# Patient Record
Sex: Female | Born: 1960 | Race: Black or African American | Hispanic: No | Marital: Married | Smoking: Never smoker
Health system: Southern US, Community
[De-identification: ages and names within clinical notes are randomized; demographics above are authoritative.]

## PROBLEM LIST (undated history)

## (undated) HISTORY — PX: NO PAST SURGERIES: SHX2092

---

## 2017-02-23 ENCOUNTER — Telehealth: Payer: Self-pay | Admitting: Emergency Medicine

## 2017-02-23 ENCOUNTER — Ambulatory Visit
Admission: EM | Admit: 2017-02-23 | Discharge: 2017-02-23 | Disposition: A | Discharge status: Home / Self Care | Payer: Managed Care, Other (non HMO) | Attending: Family Medicine | Admitting: Family Medicine

## 2017-02-23 ENCOUNTER — Other Ambulatory Visit: Payer: Self-pay

## 2017-02-23 DIAGNOSIS — R059 Cough, unspecified: Secondary | ICD-10-CM

## 2017-02-23 DIAGNOSIS — J9801 Acute bronchospasm: Secondary | ICD-10-CM

## 2017-02-23 DIAGNOSIS — R05 Cough: Secondary | ICD-10-CM

## 2017-02-23 DIAGNOSIS — J0100 Acute maxillary sinusitis, unspecified: Secondary | ICD-10-CM

## 2017-02-23 MED ORDER — BENZONATATE 100 MG PO CAPS: 100.0000 mg | ORAL_CAPSULE | Freq: Three times a day (TID) | ORAL | 0 refills | Status: DC | PRN

## 2017-02-23 MED ORDER — PREDNISONE 10 MG PO TABS: ORAL_TABLET | ORAL | 0 refills | Status: DC

## 2017-02-23 MED ORDER — ALBUTEROL SULFATE HFA 108 (90 BASE) MCG/ACT IN AERS: 2.0000 | INHALATION_SPRAY | RESPIRATORY_TRACT | 0 refills | Status: DC | PRN

## 2017-02-23 MED ORDER — DOXYCYCLINE HYCLATE 100 MG PO CAPS: 100.0000 mg | ORAL_CAPSULE | Freq: Two times a day (BID) | ORAL | 0 refills | Status: DC

## 2017-02-23 MED ORDER — HYDROCOD POLST-CPM POLST ER 10-8 MG/5ML PO SUER: 5.0000 mL | Freq: Every evening | ORAL | 0 refills | Status: DC | PRN

## 2017-02-23 NOTE — Telephone Encounter (Signed)
[facility]ex sent to [facility], [facility] out of stock, nursing canceled original rx.

## 2017-02-23 NOTE — Discharge Instructions (Signed)
Take medication as prescribed. Rest. Drink plenty of fluids.  ° °Follow up with your primary care physician this week as needed. Return to Urgent care for new or worsening concerns.  ° °

## 2017-02-23 NOTE — ED Triage Notes (Signed)
Pt c/o coughing, heaviness in her chest, hot/cold chills, dizzy, and nausea for about a week. OTC meds haven't helped.

## 2017-02-23 NOTE — ED Provider Notes (Signed)
[facility] ____________________________________________  Time seen: Approximately 11:21 AM  I have reviewed the triage vital signs and the nursing notes.   HISTORY  Chief Complaint Cough   HPI Christina Shelton is a 57 y.o. female presenting for was 1.5 weeks of cough and congestion.  States symptoms started off more like a cold with runny nose, nasal congestion, then progressed to a cough.  Patient reports towards the end of last week she thought that she was starting to get better, and reports worsened over the weekend.  States that she has had increased sinus pressure sensation, thick postnasal drainage as well as cough.  States that she does have some soreness from coughing so frequently as as some intermittent chest tightness sensation with cough.  Denies chest pain or shortness of breath.  States no known wheezing.  States cough disrupting sleep. Reports she has a had a history of significant seasonal allergies and sinusitis, and reports she is been told in the past she has borderline asthma due to allergies.  Has been trying multiple over-the-counter cough and congestion agents.  States at sickness onset she may have had a fever, denies recent known fevers.  Has continue to remain active.  Try to go to work today but was sent home due to her cough.  States that she works at an [employer] and she has had a lot of patients with cough and congestion symptoms.  Has continued to overall eat and drink well.  States some throat irritation no persistent sore throat.  Denies recent sickness.  Denies cardiac history, renal insufficiency, or blood clot history.  Denies chest pain, shortness of breath, abdominal pain, dysuria, extremity pain, extremity swelling or rash. Denies recent sickness. Denies recent antibiotic use.     History reviewed. No pertinent past medical history. Allergies  There are no active problems to display for this patient.   History reviewed. No  pertinent surgical history.   No current facility-administered medications for this encounter.   Current Outpatient Medications:  .  albuterol (PROVENTIL HFA;VENTOLIN HFA) 108 (90 Base) MCG/ACT inhaler, Inhale 2 puffs into the lungs every 6 (six) hours as needed for wheezing or shortness of breath., Disp: , Rfl:  .  cetirizine (ZYRTEC) 5 MG tablet, Take 5 mg by mouth daily., Disp: , Rfl:  .  albuterol (PROVENTIL HFA;VENTOLIN HFA) 108 (90 Base) MCG/ACT inhaler, Inhale 2 puffs into the lungs every 4 (four) hours as needed for wheezing., Disp: 1 Inhaler, Rfl: 0 .  benzonatate (TESSALON PERLES) 100 MG capsule, Take 1 capsule (100 mg total) by mouth 3 (three) times daily as needed for cough., Disp: 15 capsule, Rfl: 0 .  chlorpheniramine-HYDROcodone (TUSSIONEX PENNKINETIC ER) 10-8 MG/5ML SUER, Take 5 mLs by mouth at bedtime as needed for cough. do not drive or operate machinery while taking as can cause drowsiness., Disp: 75 mL, Rfl: 0 .  doxycycline (VIBRAMYCIN) 100 MG capsule, Take 1 capsule (100 mg total) by mouth 2 (two) times daily., Disp: 20 capsule, Rfl: 0 .  predniSONE (DELTASONE) 10 MG tablet, Start 60 mg po day one, then 50 mg po day two, taper by 10 mg daily until complete., Disp: 21 tablet, Rfl: 0  Allergies Patient has no known allergies.  History reviewed. No pertinent family history.  Social History Social History   Tobacco Use  . Smoking status: Never Smoker  . Smokeless tobacco: Never Used  Substance Use Topics  . Alcohol use: No    Frequency: Never  . Drug use:  No    Review of Systems Constitutional: No fever/chills ENT: As above.  Cardiovascular: Denies chest pain. Respiratory: Denies shortness of breath. Gastrointestinal: No abdominal pain.  No nausea, no vomiting.  No diarrhea.  No constipation. Genitourinary: Negative for dysuria. Musculoskeletal: Negative for back pain. Skin: Negative for rash. Neurological: Negative for focal weakness or  numbness. .  ____________________________________________   PHYSICAL EXAM:  VITAL SIGNS: ED Triage Vitals  Enc Vitals Group     BP 02/23/17 1030 132/73     Pulse Rate 02/23/17 1030 71     Resp 02/23/17 1030 16     Temp 02/23/17 1030 98 F (36.7 C)     Temp Source 02/23/17 1030 Oral     SpO2 02/23/17 1030 100 %     Weight 02/23/17 1032 156 lb (70.8 kg)     Height --      Head Circumference --      Peak Flow --      Pain Score 02/23/17 1032 8     Pain Loc --      Pain Edu? --      Excl. in [place]? --     Constitutional: Alert and oriented. Well appearing and in no acute distress. Eyes: Conjunctivae are normal.  Head: Atraumatic.Mild to moderate tenderness to palpation bilateral maxillary sinuses. No frontal sinus tenderness to palpation. No swelling. No erythema.   Ears: no erythema, normal TMs bilaterally.   Nose: nasal congestion with bilateral nasal turbinate erythema and edema.   Mouth/Throat: Mucous membranes are moist.  Oropharynx non-erythematous.No tonsillar swelling or exudate.  Neck: No stridor.  No cervical spine tenderness to palpation. Hematological/Lymphatic/Immunilogical: No cervical lymphadenopathy. Cardiovascular: Normal rate, regular rhythm. Grossly normal heart sounds.  Good peripheral circulation. Respiratory: Normal respiratory effort.  No retractions. No wheezes, rales or rhonchi. Good air movement. Dry intermittent cough in room with bronchospasm. Speaks in complete sentences.  Musculoskeletal: No lower extremity tenderness nor edema. No cervical, thoracic or lumbar tenderness to palpation.  Neurologic:  Normal speech and language. No gross focal neurologic deficits are appreciated. No gait instability. Skin:  Skin is warm, dry and intact. No rash noted. Psychiatric: Mood and affect are normal. Speech and behavior are normal.  ___________________________________________   LABS (all labs ordered are listed, but only abnormal results are displayed)  Labs  Reviewed - No data to display  RADIOLOGY  No results found. ____________________________________________   PROCEDURES Procedures   INITIAL IMPRESSION / ASSESSMENT AND PLAN / ED COURSE  Pertinent labs & imaging results that were available during my care of the patient were reviewed by me and considered in my medical decision making (see chart for details).  Well-appearing patient.  No acute distress.  Suspect recent viral illness.  Patient now with suspected secondary sinusitis and continued cough with bronchospasm.  Lungs otherwise clear throughout.  Discussed evaluation of chest x-ray deferred at this time, patient agrees.  Will treat patient with oral doxycycline, prednisone taper, as needed albuterol inhaler, PRN Tessalon Perles and as needed Tussionex.  Encourage rest, fluids, supportive care.  Work note given for today and tomorrow.Discussed indication, risks and benefits of medications with patient.  Discussed follow up with Primary care physician this week. Discussed follow up and return parameters including no resolution or any worsening concerns. Patient verbalized understanding and agreed to plan.   ____________________________________________   FINAL CLINICAL IMPRESSION(S) / ED DIAGNOSES  Final diagnoses:  Acute maxillary sinusitis, recurrence not specified  Cough  Bronchospasm     ED  Discharge Orders        Ordered    benzonatate (TESSALON PERLES) 100 MG capsule  3 times daily PRN     02/23/17 1111    chlorpheniramine-HYDROcodone (TUSSIONEX PENNKINETIC ER) 10-8 MG/5ML SUER  At bedtime PRN     02/23/17 1111    albuterol (PROVENTIL HFA;VENTOLIN HFA) 108 (90 Base) MCG/ACT inhaler  Every 4 hours PRN     02/23/17 1111    predniSONE (DELTASONE) 10 MG tablet     02/23/17 1111    doxycycline (VIBRAMYCIN) 100 MG capsule  2 times daily     02/23/17 1111       Note: This dictation was prepared with Dragon dictation along with smaller phrase technology. Any  transcriptional errors that result from this process are unintentional.         Renford Dills, NP 02/23/17 1130

## 2017-02-26 ENCOUNTER — Telehealth: Payer: Self-pay | Admitting: Emergency Medicine

## 2017-02-26 NOTE — Telephone Encounter (Signed)
Tried to call patient to follow up after recent visit. Phone number listed is disconnected.

## 2017-06-23 ENCOUNTER — Other Ambulatory Visit: Payer: Self-pay

## 2017-06-23 ENCOUNTER — Encounter: Payer: Self-pay | Admitting: Emergency Medicine

## 2017-06-23 ENCOUNTER — Ambulatory Visit
Admission: EM | Admit: 2017-06-23 | Discharge: 2017-06-23 | Disposition: A | Discharge status: Home / Self Care | Payer: 59 | Attending: Family Medicine | Admitting: Family Medicine

## 2017-06-23 ENCOUNTER — Ambulatory Visit (INDEPENDENT_AMBULATORY_CARE_PROVIDER_SITE_OTHER): Payer: 59

## 2017-06-23 DIAGNOSIS — M7750 Other enthesopathy of unspecified foot: Secondary | ICD-10-CM

## 2017-06-23 DIAGNOSIS — M779 Enthesopathy, unspecified: Secondary | ICD-10-CM | POA: Diagnosis not present

## 2017-06-23 DIAGNOSIS — M7752 Other enthesopathy of left foot: Secondary | ICD-10-CM

## 2017-06-23 NOTE — Discharge Instructions (Signed)
Over the counter anti-inflammatories Rest, ice, elevation

## 2017-06-23 NOTE — ED Provider Notes (Signed)
[facility]    CSN: [id number] Arrival date & time: 06/23/17  1804     History   Chief Complaint Chief Complaint  Patient presents with  . Foot Pain    HPI Christina Shelton is a 57 y.o. female.   57 yo female with a c/o left foot/ankle pain for the past week. Denies any traumatic injuries, fevers, chills, rash. Has noticed some mild swelling around the ankle. States that she walks a lot.   The history is provided by the patient.    History reviewed. No pertinent past medical history.  There are no active problems to display for this patient.   History reviewed. No pertinent surgical history.  OB History   None      Home Medications    Prior to Admission medications   Medication Sig Start Date End Date Taking? Authorizing Provider  albuterol (PROVENTIL HFA;VENTOLIN HFA) 108 (90 Base) MCG/ACT inhaler Inhale 2 puffs into the lungs every 6 (six) hours as needed for wheezing or shortness of breath.   Yes [provider]  cetirizine (ZYRTEC) 5 MG tablet Take 5 mg by mouth daily.   Yes [provider]  albuterol (PROVENTIL HFA;VENTOLIN HFA) 108 (90 Base) MCG/ACT inhaler Inhale 2 puffs into the lungs every 4 (four) hours as needed for wheezing. 02/23/17   Renford Dills, NP  benzonatate (TESSALON PERLES) 100 MG capsule Take 1 capsule (100 mg total) by mouth 3 (three) times daily as needed for cough. 02/23/17   Renford Dills, NP  chlorpheniramine-HYDROcodone [facility] PENNKINETIC ER) 10-8 MG/5ML SUER Take 5 mLs by mouth at bedtime as needed for cough. do not drive or operate machinery while taking as can cause drowsiness. 02/23/17   Renford Dills, NP  doxycycline (VIBRAMYCIN) 100 MG capsule Take 1 capsule (100 mg total) by mouth 2 (two) times daily. 02/23/17   Renford Dills, NP  predniSONE (DELTASONE) 10 MG tablet Start 60 mg po day one, then 50 mg po day two, taper by 10 mg daily until complete. 02/23/17   Renford Dills, NP    Family  History No family history on file.  Social History Social History   Tobacco Use  . Smoking status: Never Smoker  . Smokeless tobacco: Never Used  Substance Use Topics  . Alcohol use: No    Frequency: Never  . Drug use: No     Allergies   Patient has no known allergies.   Review of Systems Review of Systems   Physical Exam Triage Vital Signs ED Triage Vitals  Enc Vitals Group     BP 06/23/17 1816 121/81     Pulse Rate 06/23/17 1816 69     Resp 06/23/17 1816 18     Temp 06/23/17 1816 98.4 F (36.9 C)     Temp Source 06/23/17 1816 Oral     SpO2 06/23/17 1816 98 %     Weight 06/23/17 1814 160 lb (72.6 kg)     Height 06/23/17 1814 5\' 4"  (1.626 m)     Head Circumference --      Peak Flow --      Pain Score 06/23/17 1814 8     Pain Loc --      Pain Edu? --      Excl. in [place]? --    No data found.  Updated Vital Signs BP 121/81 (BP Location: Left Arm)   Pulse 69   Temp 98.4 F (36.9 C) (Oral)   Resp 18   Ht 5\' 4"  (  1.626 m)   Wt 160 lb (72.6 kg)   SpO2 98%   BMI 27.46 kg/m   Visual Acuity Right Eye Distance:   Left Eye Distance:   Bilateral Distance:    Right Eye Near:   Left Eye Near:    Bilateral Near:     Physical Exam  Constitutional: She appears well-developed and well-nourished. No distress.  Musculoskeletal:       Left ankle: She exhibits swelling (mild). She exhibits normal range of motion, no ecchymosis, no deformity, no laceration and normal pulse. Tenderness. Lateral malleolus and AITFL tenderness found. No medial malleolus, no CF ligament, no posterior TFL, no head of 5th metatarsal and no proximal fibula tenderness found. Achilles tendon normal.       Left foot: Normal.  Skin: She is not diaphoretic.  Nursing note and vitals reviewed.    UC Treatments / Results  Labs (all labs ordered are listed, but only abnormal results are displayed) Labs Reviewed - No data to display  EKG None  Radiology Dg Ankle Complete Left  Result Date:  06/23/2017 CLINICAL DATA:  57 year old female with history of left-sided foot pain for the past week. EXAM: LEFT ANKLE COMPLETE - 3+ VIEW COMPARISON:  No priors. FINDINGS: There is no evidence of fracture, dislocation, or joint effusion. There is no evidence of arthropathy or other focal bone abnormality. Soft tissues are unremarkable. IMPRESSION: Negative. Electronically Signed   By: Trudie Reedaniel  Entrikin M.D.   On: 06/23/2017 19:39    Procedures Procedures (including critical care time)  Medications Ordered in UC Medications - No data to display  Initial Impression / Assessment and Plan / UC Course  I have reviewed the triage vital signs and the nursing notes.  Pertinent labs & imaging results that were available during my care of the patient were reviewed by me and considered in my medical decision making (see chart for details).      Final Clinical Impressions(s) / UC Diagnoses   Final diagnoses:  Tendonitis of foot  Tendonitis of ankle, left     Discharge Instructions     Over the counter anti-inflammatories Rest, ice, elevation    ED Prescriptions    None     1. diagnosis reviewed with patient 2. Recommend supportive treatment as above  3. Follow-up prn if symptoms worsen or don't improve   Controlled Substance Prescriptions [place] Controlled Substance Registry consulted? Not Applicable   Payton Mccallumonty, Lurdes Haltiwanger, MD 06/23/17 2044

## 2017-06-23 NOTE — ED Triage Notes (Signed)
Patient c/o left foot pain that started 1 week ago. Denies injury to foot. Patient also reports bruise to left leg right under her knee that has been there for 2-3 weeks.

## 2019-07-06 ENCOUNTER — Ambulatory Visit
Admission: EM | Admit: 2019-07-06 | Discharge: 2019-07-06 | Disposition: A | Discharge status: Home / Self Care | Payer: 59 | Attending: Family Medicine | Admitting: Family Medicine

## 2019-07-06 ENCOUNTER — Other Ambulatory Visit: Payer: Self-pay

## 2019-07-06 DIAGNOSIS — M62838 Other muscle spasm: Secondary | ICD-10-CM

## 2019-07-06 DIAGNOSIS — H6982 Other specified disorders of Eustachian tube, left ear: Secondary | ICD-10-CM | POA: Diagnosis not present

## 2019-07-06 MED ORDER — TIZANIDINE HCL 4 MG PO TABS: 4.0000 mg | ORAL_TABLET | Freq: Three times a day (TID) | ORAL | 0 refills | Status: AC | PRN

## 2019-07-06 NOTE — ED Triage Notes (Signed)
Patient has swelling in her left shoulder that she has noticed over the last few months. Patient states that she started noticing left ear pain x 3 days that seems to be worse at night.

## 2019-07-06 NOTE — ED Provider Notes (Signed)
[facility]  CSN: [id number] Arrival date & time: 07/06/19  1039   History   Chief Complaint Chief Complaint  Patient presents with   Otalgia   Shoulder Pain   HPI  59 year old female presents the above complaint.  Patient reports ongoing pain in the left shoulder region for the past 2 months.  Pain seems to radiate upwards.  Area is tight.  Patient reports that she has noticed left ear pain for the past 3 days.  Seems to be worse at night.  Pain 7/10 in severity.  Patient states she suffers from allergies.  She is taking Zyrtec daily.  She uses Flonase intermittently.  No other associated symptoms.  No other complaints.  Home Medications    Prior to Admission medications   Medication Sig Start Date End Date Taking? Authorizing Provider  albuterol (PROVENTIL HFA;VENTOLIN HFA) 108 (90 Base) MCG/ACT inhaler Inhale 2 puffs into the lungs every 6 (six) hours as needed for wheezing or shortness of breath.   Yes [provider]  cetirizine (ZYRTEC) 5 MG tablet Take 5 mg by mouth daily.   Yes [provider]  Multiple Vitamins-Minerals (MULTIVITAMIN WITH MINERALS) tablet Take 1 tablet by mouth daily.   Yes [provider]  tiZANidine (ZANAFLEX) 4 MG tablet Take 1 tablet (4 mg total) by mouth every 8 (eight) hours as needed for muscle spasms. 07/06/19   Coral Spikes, DO   Social History Social History   Tobacco Use   Smoking status: Never Smoker   Smokeless tobacco: Never Used  Vaping Use   Vaping Use: Never used  Substance Use Topics   Alcohol use: No   Drug use: No     Allergies   Amoxicillin   Review of Systems Review of Systems  HENT: Positive for ear pain.   Musculoskeletal:       Left shoulder pain.   Physical Exam Triage Vital Signs ED Triage Vitals  Enc Vitals Group     BP 07/06/19 1055 136/82     Pulse Rate 07/06/19 1055 69     Resp 07/06/19 1055 18     Temp 07/06/19 1055 98.1 F (36.7 C)     Temp Source  07/06/19 1055 Oral     SpO2 07/06/19 1055 99 %     Weight 07/06/19 1052 160 lb (72.6 kg)     Height 07/06/19 1052 5\' 4"  (1.626 m)     Head Circumference --      Peak Flow --      Pain Score 07/06/19 1052 7     Pain Loc --      Pain Edu? --      Excl. in [place]? --    Updated Vital Signs BP 136/82 (BP Location: Left Arm)    Pulse 69    Temp 98.1 F (36.7 C) (Oral)    Resp 18    Ht 5\' 4"  (1.626 m)    Wt 72.6 kg    SpO2 99%    BMI 27.46 kg/m   Visual Acuity Right Eye Distance:   Left Eye Distance:   Bilateral Distance:    Right Eye Near:   Left Eye Near:    Bilateral Near:     Physical Exam Vitals and nursing note reviewed.  Constitutional:      General: She is not in acute distress.    Appearance: Normal appearance. She is not ill-appearing.  HENT:     Head: Normocephalic and atraumatic.  Right Ear: Tympanic membrane normal.     Left Ear: Tympanic membrane normal.  Eyes:     General:        Right eye: No discharge.        Left eye: No discharge.     Conjunctiva/sclera: Conjunctivae normal.  Cardiovascular:     Rate and Rhythm: Normal rate and regular rhythm.  Pulmonary:     Effort: Pulmonary effort is normal. No respiratory distress.  Musculoskeletal:     Comments: Tension/spasm noted over the left trapezius.  Tender to palpation.  Neurological:     Mental Status: She is alert.  Psychiatric:        Mood and Affect: Mood normal.        Behavior: Behavior normal.    UC Treatments / Results  Labs (all labs ordered are listed, but only abnormal results are displayed) Labs Reviewed - No data to display  EKG   Radiology No results found.  Procedures Procedures (including critical care time)  Medications Ordered in UC Medications - No data to display  Initial Impression / Assessment and Plan / UC Course  I have reviewed the triage vital signs and the nursing notes.  Pertinent labs & imaging results that were available during my care of the patient were  reviewed by me and considered in my medical decision making (see chart for details).    59 year old female presents with eustachian tube dysfunction and trapezius muscle spasm.  Advised Flonase for eustachian tube dysfunction.  Advised heat and Zanaflex as needed for muscle spasm.  Supportive care.  Final Clinical Impressions(s) / UC Diagnoses   Final diagnoses:  Dysfunction of left eustachian tube  Trapezius muscle spasm     Discharge Instructions     Flonase daily.  Muscle relaxer as needed. Massage, hot shower/bath, heating pad.  Take care  Dr. Adriana Simas    ED Prescriptions    Medication Sig Dispense Auth. Provider   tiZANidine (ZANAFLEX) 4 MG tablet Take 1 tablet (4 mg total) by mouth every 8 (eight) hours as needed for muscle spasms. 30 tablet Tommie Sams, DO     PDMP not reviewed this encounter.   Tommie Sams, [place] 07/06/19 1201

## 2019-07-06 NOTE — Discharge Instructions (Addendum)
Flonase daily.  Muscle relaxer as needed. Massage, hot shower/bath, heating pad.  Take care  Dr. Adriana Simas

## 2019-10-09 IMAGING — CR DG ANKLE COMPLETE 3+V*L*
4 series · 4 of 4 positions shown · non-contrast
Comparison: No priors.

CLINICAL DATA: 56-year-old female with history of left-sided foot
pain for the past week.

EXAM:
LEFT ANKLE COMPLETE - 3+ VIEW

[ankle ap]
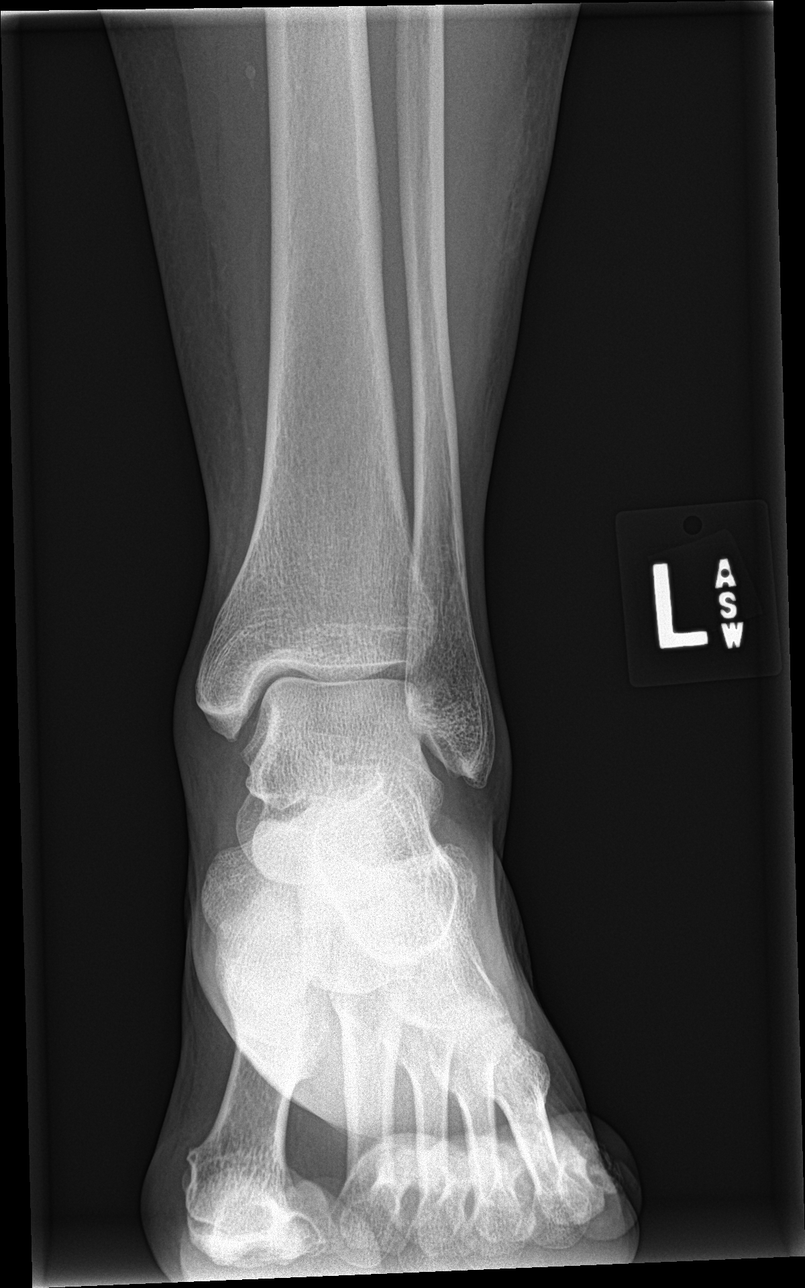

[ankle obl]
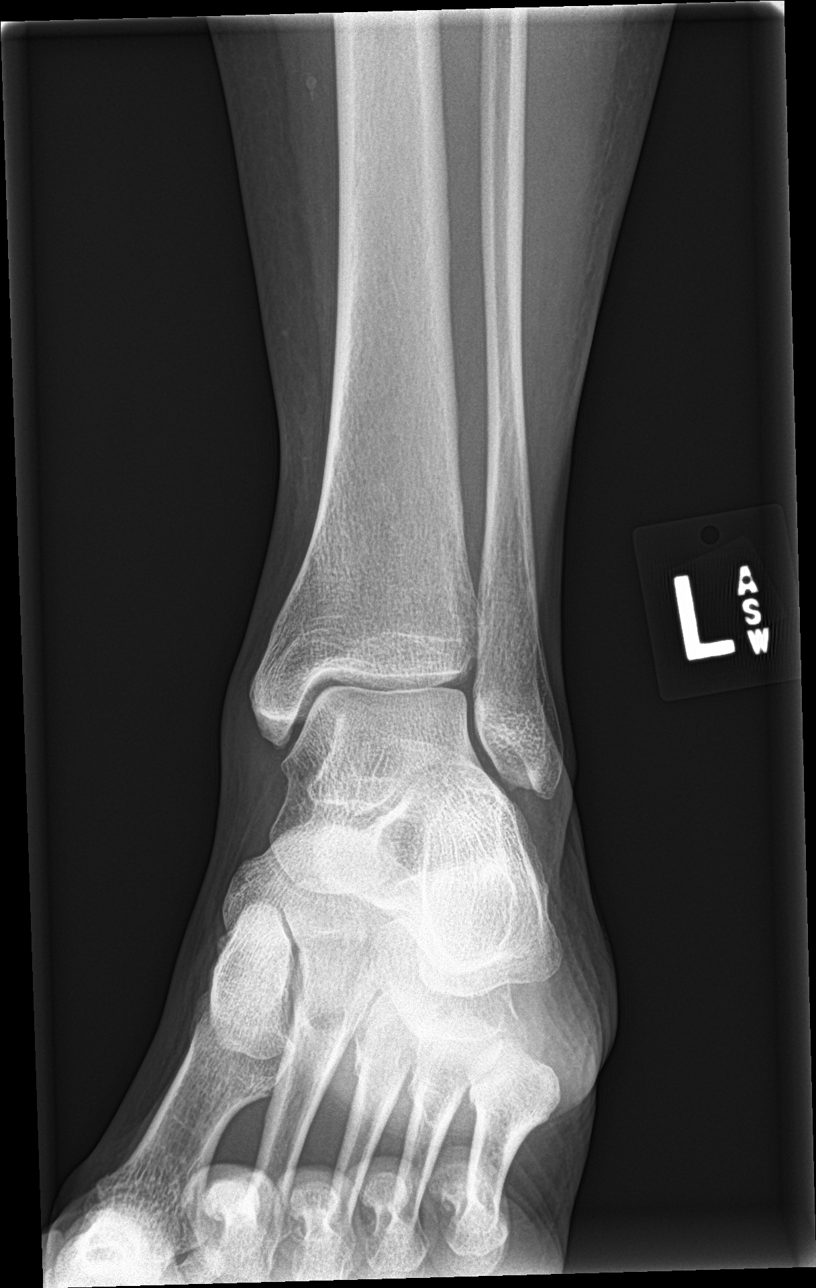

[ankle lat (1 of 2)]
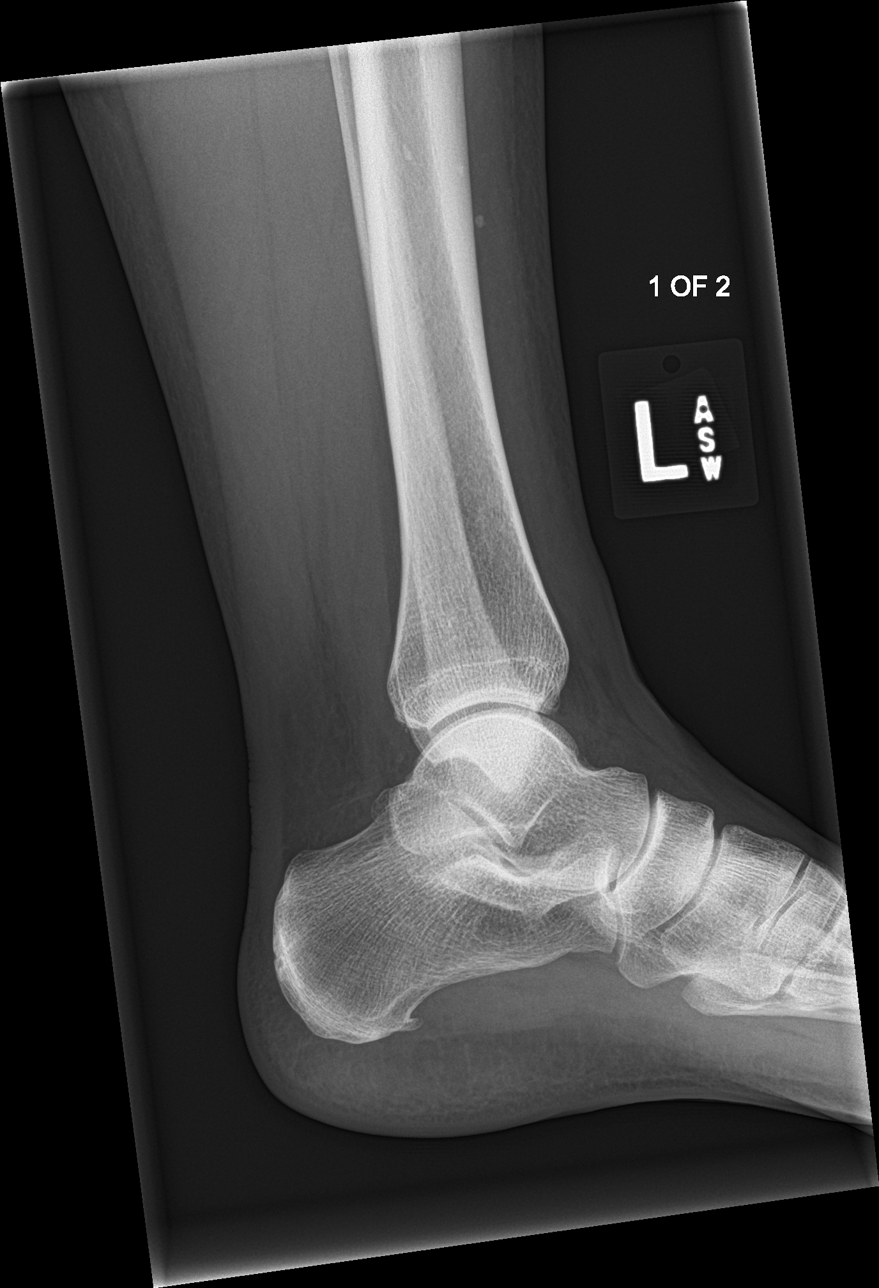

[ankle lat (2 of 2)]
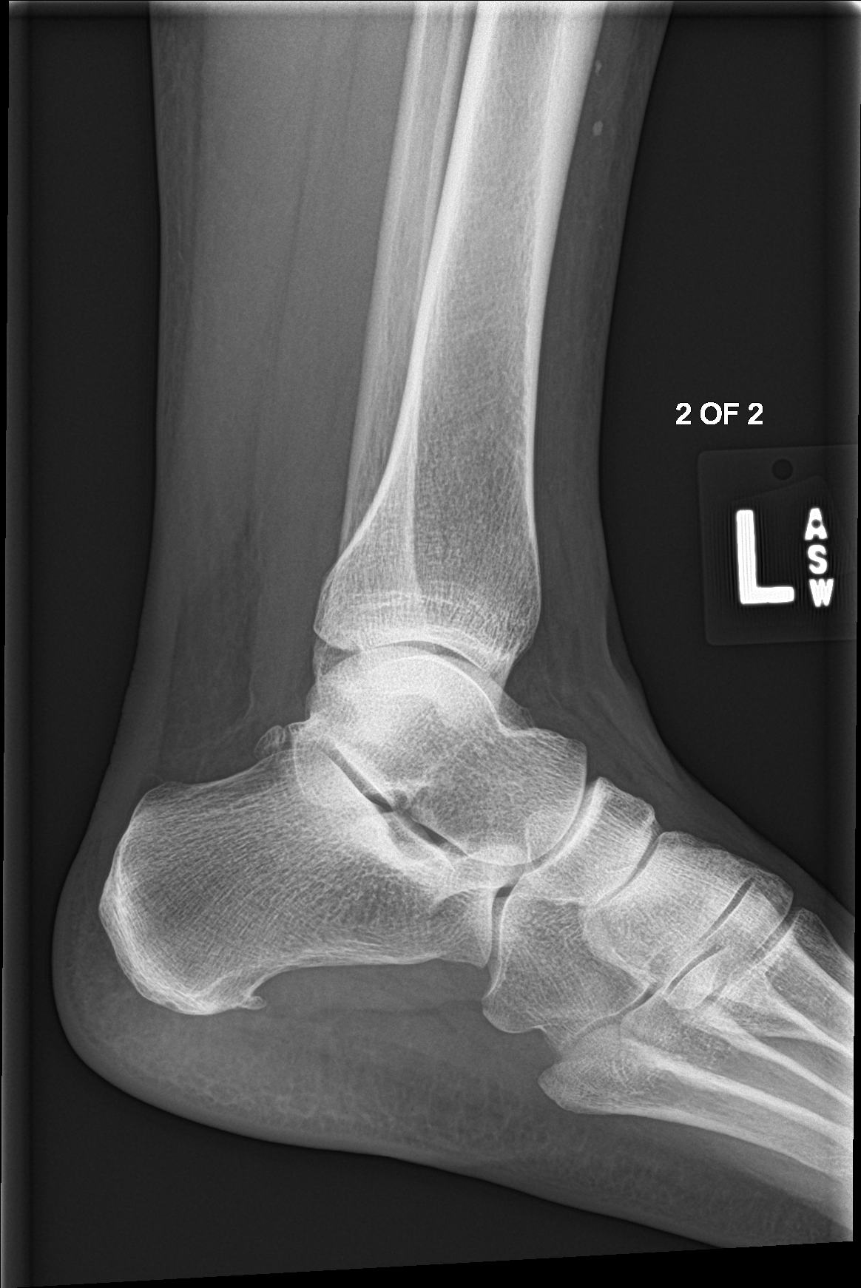

[4 of 4 positions shown; findings below may reference images not displayed]

FINDINGS: There is no evidence of fracture, dislocation, or joint effusion.
There is no evidence of arthropathy or other focal bone abnormality.
Soft tissues are unremarkable.
IMPRESSION: Negative.

## 2023-02-05 ENCOUNTER — Encounter: Payer: Self-pay | Admitting: Emergency Medicine

## 2023-02-05 ENCOUNTER — Ambulatory Visit (INDEPENDENT_AMBULATORY_CARE_PROVIDER_SITE_OTHER): Payer: 59

## 2023-02-05 ENCOUNTER — Ambulatory Visit
Admission: EM | Admit: 2023-02-05 | Discharge: 2023-02-05 | Disposition: A | Discharge status: Home / Self Care | Payer: 59 | Attending: Emergency Medicine | Admitting: Emergency Medicine

## 2023-02-05 DIAGNOSIS — R0602 Shortness of breath: Secondary | ICD-10-CM | POA: Diagnosis not present

## 2023-02-05 NOTE — Discharge Instructions (Addendum)
Your EKG shows some abnormalities which may be the cause of your shortness of breath on exertion though given the duration of your symptoms I do not believe that these are acute.  I have referred you to cardiology for further evaluation.  Your chest x-ray also showed a possible nodule in your right midlung and are recommending a noncontrast CT scan of your chest.  Please contact your PCP for follow-up and they can order this study outpatient.  If you develop any chest pain, increasing shortness of breath, sweating, radiation to your jaw or back, nausea, or leg swelling you need to be evaluated in the emergency department.

## 2023-02-05 NOTE — ED Provider Notes (Signed)
[facility]    CSN: [id number] Arrival date & time: 02/05/23  1148      History   Chief Complaint Chief Complaint  Patient presents with   Shortness of Breath    HPI Christina Shelton is a 63 y.o. female.   HPI  63 year old female with no significant past medical history presents for evaluation of shortness of breath.  She reports that symptoms were initially off and on but over the last week and a half they become constant and are associated with a substernal chest pressure.  She is also been experiencing shortness of breath on exertion over the last week and a half.  She denies any syncope, chest pain, belching, burning in her esophagus, sour taste in her mouth, or sore throat.  She does not smoke.  No known personal or family cardiac history.  History reviewed. No pertinent past medical history.  There are no active problems to display for this patient.   Past Surgical History:  Procedure Laterality Date   NO PAST SURGERIES      OB History   No obstetric history on file.      Home Medications    Prior to Admission medications   Medication Sig Start Date End Date Taking? Authorizing Provider  albuterol (PROVENTIL HFA;VENTOLIN HFA) 108 (90 Base) MCG/ACT inhaler Inhale 2 puffs into the lungs every 6 (six) hours as needed for wheezing or shortness of breath.   Yes [provider]  cetirizine (ZYRTEC) 5 MG tablet Take 5 mg by mouth daily.   Yes [provider]  Multiple Vitamins-Minerals (MULTIVITAMIN WITH MINERALS) tablet Take 1 tablet by mouth daily.   Yes [provider]  tiZANidine (ZANAFLEX) 4 MG tablet Take 1 tablet (4 mg total) by mouth every 8 (eight) hours as needed for muscle spasms. 07/06/19  Yes Tommie Sams, DO    Family History History reviewed. No pertinent family history.  Social History Social History   Tobacco Use   Smoking status: Never   Smokeless tobacco: Never  Vaping Use   Vaping status: Never Used   Substance Use Topics   Alcohol use: No   Drug use: No     Allergies   Amoxicillin   Review of Systems Review of Systems  Respiratory:  Positive for shortness of breath. Negative for cough and wheezing.   Cardiovascular:  Positive for chest pain. Negative for palpitations and leg swelling.  Neurological:  Negative for syncope.     Physical Exam Triage Vital Signs ED Triage Vitals  Encounter Vitals Group     BP      Systolic BP Percentile      Diastolic BP Percentile      Pulse      Resp      Temp      Temp src      SpO2      Weight      Height      Head Circumference      Peak Flow      Pain Score      Pain Loc      Pain Education      Exclude from Growth Chart    No data found.  Updated Vital Signs BP 131/86 (BP Location: Left Arm)   Pulse 82   Temp 98.1 F (36.7 C) (Oral)   Ht 5\' 4"  (1.626 m)   Wt 167 lb (75.8 kg)   SpO2 99%   BMI 28.67 kg/m   Visual  Acuity Right Eye Distance:   Left Eye Distance:   Bilateral Distance:    Right Eye Near:   Left Eye Near:    Bilateral Near:     Physical Exam Vitals and nursing note reviewed.  Constitutional:      Appearance: Normal appearance. She is not ill-appearing.  HENT:     Head: Normocephalic and atraumatic.  Cardiovascular:     Rate and Rhythm: Normal rate and regular rhythm.     Pulses: Normal pulses.     Heart sounds: Normal heart sounds. No murmur heard.    No friction rub. No gallop.  Pulmonary:     Effort: Pulmonary effort is normal.     Breath sounds: Normal breath sounds. No wheezing, rhonchi or rales.  Musculoskeletal:     Right lower leg: No edema.     Left lower leg: No edema.  Skin:    General: Skin is warm and dry.     Capillary Refill: Capillary refill takes less than 2 seconds.     Findings: No rash.  Neurological:     General: No focal deficit present.     Mental Status: She is alert and oriented to person, place, and time.      UC Treatments / Results  Labs (all labs  ordered are listed, but only abnormal results are displayed) Labs Reviewed - No data to display  EKG Normal sinus rhythm with a mention rate of 76 bpm PR interval 130 ms QRS duration 80 ms QT/QTc 362/407 ms Flattening of the ST segment in 2, 3, and aVF with T wave inversions in V4, V5, V6  Radiology DG Chest 2 View Result Date: 02/05/2023 CLINICAL DATA:  63 year old female with shortness of breath and substernal chest pain. EXAM: CHEST - 2 VIEW COMPARISON:  None Available. FINDINGS: PA and lateral views 1240 hours. Lung volumes and mediastinal contours are within normal limits. Visualized tracheal air column is within normal limits. No pneumothorax, pulmonary edema, pleural effusion or confluent lung opacity. Posterior right 8th rib level 6 mm density projecting over the right lung there. Elsewhere lung markings appear normal. Osteopenia with disc and endplate degeneration in the thoracic spine. Negative visible bowel gas. IMPRESSION: 1. Mid right lung nodule (6 mm) versus artifact from a right 8th rib bone island (favored). Chest CT (noncontrast should suffice) would confirm. 2. No superimposed acute cardiopulmonary abnormality. Electronically Signed   By: Odessa Fleming M.D.   On: 02/05/2023 13:00    Procedures Procedures (including critical care time)  Medications Ordered in UC Medications - No data to display  Initial Impression / Assessment and Plan / UC Course  I have reviewed the triage vital signs and the nursing notes.  Pertinent labs & imaging results that were available during my care of the patient were reviewed by me and considered in my medical decision making (see chart for details).   Patient is a pleasant, nontoxic-appearing 63 year old female presenting for evaluation of shortness of breath as outlined HPI above.  In the exam room she is in no acute distress and she is able to speak in full sentence without dyspnea or tachypnea.  Room air oxygen saturation is 99% and her  respiratory rate is 18.  Cardiopulmonary exam reveals S1-S2 heart sounds with regular rate and rhythm and lung sounds are clear to auscultation in all fields.  She has no swelling in her distal extremities and her DP and PT pulses bilaterally are 2+.  Etiology of the patient's symptoms is unclear  though there is concerned that they are cardiac in nature.  I will obtain an EKG and chest x-ray.  EKG shows normal sinus rhythm with nonspecific T wave abnormalities.  There is a flattening of the ST segment in 2, 3, and aVF with T wave inversions in V4, V5, and V6.  No other tracings are available for comparison in epic.  Radiology impression of chest x-ray states mid right lung nodule (6 mm) versus artifact from right eighth rib bone island (favored).  Noncontrast chest CT advised.  No superimposed acute cardiopulmonary abnormality.  Discharge patient home and have her follow-up with her primary care provider regarding the incidental finding on the chest x-ray for noncontrasted chest CT.  I will also refer patient to cardiology given her abnormal EKG for further evaluation.  ER precautions reviewed.   Final Clinical Impressions(s) / UC Diagnoses   Final diagnoses:  Shortness of breath     Discharge Instructions      Your EKG shows some abnormalities which may be the cause of your shortness of breath on exertion though given the duration of your symptoms I do not believe that these are acute.  I have referred you to cardiology for further evaluation.  Your chest x-ray also showed a possible nodule in your right midlung and are recommending a noncontrast CT scan of your chest.  Please contact your PCP for follow-up and they can order this study outpatient.  If you develop any chest pain, increasing shortness of breath, sweating, radiation to your jaw or back, nausea, or leg swelling you need to be evaluated in the emergency department.     ED Prescriptions   None    PDMP not reviewed this  encounter.   Becky Augusta, NP 02/05/23 1309

## 2023-02-05 NOTE — ED Triage Notes (Signed)
Pt c/o SOB x3weeks  Pt states that it feels like something is mashing on her chest for the last few weeks.

## 2023-05-06 ENCOUNTER — Ambulatory Visit: Payer: 59 | Admitting: Cardiology

## 2023-07-09 ENCOUNTER — Ambulatory Visit: Payer: Self-pay | Attending: Cardiology | Admitting: Cardiology
